# Patient Record
Sex: Male | Born: 1985 | Race: Black or African American | Hispanic: No | Marital: Married | Smoking: Current some day smoker
Health system: Southern US, Community
[De-identification: ages and names within clinical notes are randomized; demographics above are authoritative.]

---

## 2012-07-01 ENCOUNTER — Emergency Department (HOSPITAL_COMMUNITY): Payer: Self-pay

## 2012-07-01 ENCOUNTER — Encounter (HOSPITAL_COMMUNITY): Payer: Self-pay | Admitting: Emergency Medicine

## 2012-07-01 ENCOUNTER — Emergency Department (HOSPITAL_COMMUNITY)
Admission: EM | Admit: 2012-07-01 | Discharge: 2012-07-01 | Disposition: A | Discharge status: Home / Self Care | Payer: Self-pay | Attending: Emergency Medicine | Admitting: Emergency Medicine

## 2012-07-01 DIAGNOSIS — F17200 Nicotine dependence, unspecified, uncomplicated: Secondary | ICD-10-CM | POA: Insufficient documentation

## 2012-07-01 DIAGNOSIS — R0789 Other chest pain: Secondary | ICD-10-CM | POA: Insufficient documentation

## 2012-07-01 DIAGNOSIS — R079 Chest pain, unspecified: Secondary | ICD-10-CM

## 2012-07-01 DIAGNOSIS — R0602 Shortness of breath: Secondary | ICD-10-CM | POA: Insufficient documentation

## 2012-07-01 MED ORDER — OMEPRAZOLE 20 MG PO CPDR: 20.0000 mg | DELAYED_RELEASE_CAPSULE | Freq: Every day | ORAL | Status: DC

## 2012-07-01 NOTE — ED Notes (Signed)
Per EMS: pt pulled over c/o chest pain while driving; right sided off center non radiating lasted 15 minutes real sharp pain. No SOB and nausea. Pt states gets a lot of heart burn but this was different. 9/10 at its worst. No medication given. sats 100% on RA. BP 125/80 HR 70 SPO2 98%

## 2012-07-01 NOTE — ED Notes (Signed)
Pt denies chest pain currently; pt denies shortness of breath; denies dizziness and nausea. Pt mentating appropriately.

## 2012-07-01 NOTE — ED Notes (Signed)
Pt returned from Xray. Placed back on monitors.   

## 2012-07-01 NOTE — ED Provider Notes (Signed)
History     CSN: [id number]  Arrival date & time 07/01/12  1606   First MD Initiated Contact with Patient 07/01/12 1611      Chief Complaint  Patient presents with  . Chest Pain     The history is provided by the patient.   the patient reports development of chest discomfort while driving today.  He reports the pain was sharp and tied and located in his right chest.  This lasted approximately 15 minutes.  He had mild shortness of breath.  No recent long travel.  No history of PE or DVT.  The patient is 89 resultant has no medical problems.  No family history of early cardiac death in young males.  The patient denies exertional chest pain or shortness of breath.  He reports his occurred one other time.  He has not had anything the today.  He reports no symptoms at all this time.  Symptoms are mild to moderate when they occurred.  Vital signs are normal on arrival to the ER and were normal for EMS including a room air oxygen saturation 100%.  History reviewed. No pertinent past medical history.  History reviewed. No pertinent past surgical history.  History reviewed. No pertinent family history.  History  Substance Use Topics  . Smoking status: Current Some Day Smoker  . Smokeless tobacco: Not on file  . Alcohol Use: Yes     Comment: occasionally       Review of Systems  Cardiovascular: Positive for chest pain.  All other systems reviewed and are negative.    Allergies  Review of patient's allergies indicates no known allergies.  Home Medications   Current Outpatient Rx  Name  Route  Sig  Dispense  Refill  . OMEPRAZOLE 20 MG PO CPDR   Oral   Take 1 capsule (20 mg total) by mouth daily.   30 capsule   0     BP 125/76  Pulse 58  Temp 97.7 F (36.5 C) (Oral)  Resp 16  SpO2 99%  Physical Exam  Nursing note and vitals reviewed. Constitutional: He is oriented to person, place, and time. He appears well-developed and well-nourished.  HENT:  Head: Normocephalic  and atraumatic.  Eyes: EOM are normal.  Neck: Normal range of motion.  Cardiovascular: Normal rate, regular rhythm, normal heart sounds and intact distal pulses.   Pulmonary/Chest: Effort normal and breath sounds normal. No respiratory distress.  Abdominal: Soft. He exhibits no distension. There is no tenderness.  Musculoskeletal: Normal range of motion.  Neurological: He is alert and oriented to person, place, and time.  Skin: Skin is warm and dry.  Psychiatric: He has a normal mood and affect. Judgment normal.    ED Course  Procedures (including critical care time)   Date: 07/01/2012  Rate: 70  Rhythm: normal sinus rhythm  QRS Axis: normal  Intervals: normal  ST/T Wave abnormalities: normal  Conduction Disutrbances: none  Narrative Interpretation:   Old EKG Reviewed: No prior EKG      Labs Reviewed - No data to display Dg Chest 2 View  07/01/2012  *RADIOLOGY REPORT*  Clinical Data: Chest pain.  CHEST - 2 VIEW  Comparison: None.  Findings: There is 18 degrees of dextroconvex thoracic scoliosis as measured between T4 and T11.  Mild pectus excavatum observed with Haller index of 3.3.  Cardiac and mediastinal contours appear normal.  The lungs appear clear.  No pleural effusion is identified.  IMPRESSION:  1.  Dextroconvex thoracic scoliosis  and mild pectus excavatum as noted above.  No acute findings to account the patient's chest pain.   Original Report Authenticated By: Gaylyn Rong, M.D.    I personally reviewed the imaging tests through PACS system I reviewed available ER/hospitalization records through the EMR   1. Chest pain       MDM  The patient has no cardiac risk factors are family history of early cardiac death.  The patient's EKG and chest x-ray within normal limits.  I suspect some of this may be gastroesophageal reflux disease.  Patient be placed on Prilosec.  PCP followup        Lyanne Co, MD 07/01/12 [phone]

## 2012-07-01 NOTE — ED Notes (Signed)
Pt ambulatory leaving ED. Pt given d/c instructions and prescriptions. Pt has no further questions upon d/c and does not appear to be in any acute distress.

## 2012-07-01 NOTE — ED Notes (Signed)
Pt denies pain and shortness of breath.

## 2012-07-01 NOTE — ED Notes (Signed)
Pt currently denies chest pain; pt denies dizziness and nausea; pt denies shortness of breath. Pt mentating appropriately alert and oriented x 4.

## 2013-09-11 ENCOUNTER — Emergency Department (HOSPITAL_COMMUNITY)
Admission: EM | Admit: 2013-09-11 | Discharge: 2013-09-11 | Disposition: A | Discharge status: Home / Self Care | Payer: BC Managed Care – PPO | Attending: Emergency Medicine | Admitting: Emergency Medicine

## 2013-09-11 ENCOUNTER — Emergency Department (HOSPITAL_COMMUNITY): Payer: BC Managed Care – PPO

## 2013-09-11 ENCOUNTER — Encounter (HOSPITAL_COMMUNITY): Payer: Self-pay | Admitting: Emergency Medicine

## 2013-09-11 DIAGNOSIS — R062 Wheezing: Secondary | ICD-10-CM | POA: Insufficient documentation

## 2013-09-11 DIAGNOSIS — J111 Influenza due to unidentified influenza virus with other respiratory manifestations: Secondary | ICD-10-CM | POA: Insufficient documentation

## 2013-09-11 DIAGNOSIS — R0602 Shortness of breath: Secondary | ICD-10-CM | POA: Insufficient documentation

## 2013-09-11 DIAGNOSIS — R69 Illness, unspecified: Secondary | ICD-10-CM

## 2013-09-11 DIAGNOSIS — F17200 Nicotine dependence, unspecified, uncomplicated: Secondary | ICD-10-CM | POA: Insufficient documentation

## 2013-09-11 MED ORDER — ALBUTEROL SULFATE HFA 108 (90 BASE) MCG/ACT IN AERS
2.0000 | INHALATION_SPRAY | Freq: Once | RESPIRATORY_TRACT | Status: AC
  Administered 2013-09-11: 2 via RESPIRATORY_TRACT
  Filled 2013-09-11: qty 6.7

## 2013-09-11 MED ORDER — IBUPROFEN 800 MG PO TABS: 800.0000 mg | ORAL_TABLET | Freq: Three times a day (TID) | ORAL | Status: AC | PRN

## 2013-09-11 MED ORDER — IBUPROFEN 200 MG PO TABS
600.0000 mg | ORAL_TABLET | Freq: Once | ORAL | Status: AC
  Administered 2013-09-11: 600 mg via ORAL
  Filled 2013-09-11: qty 3

## 2013-09-11 MED ORDER — ACETAMINOPHEN 500 MG PO TABS
1000.0000 mg | ORAL_TABLET | Freq: Once | ORAL | Status: AC
  Administered 2013-09-11: 1000 mg via ORAL
  Filled 2013-09-11: qty 2

## 2013-09-11 MED ORDER — ALBUTEROL SULFATE (2.5 MG/3ML) 0.083% IN NEBU
5.0000 mg | INHALATION_SOLUTION | Freq: Once | RESPIRATORY_TRACT | Status: AC
  Administered 2013-09-11: 5 mg via RESPIRATORY_TRACT
  Filled 2013-09-11: qty 6

## 2013-09-11 MED ORDER — SODIUM CHLORIDE 0.9 % IV BOLUS (SEPSIS): 1000.0000 mL | Freq: Once | INTRAVENOUS | Status: DC

## 2013-09-11 MED ORDER — HYDROCODONE-HOMATROPINE 5-1.5 MG/5ML PO SYRP: 5.0000 mL | ORAL_SOLUTION | Freq: Four times a day (QID) | ORAL | Status: AC | PRN

## 2013-09-11 NOTE — ED Notes (Addendum)
Pt. reports dry cough with nasal congestion / chest congestion / chills / nausea and vomitting onset 3 days ago . Pt. also reported right groin pain .

## 2013-09-11 NOTE — ED Provider Notes (Signed)
Medical screening examination/treatment/procedure(s) were conducted as a shared visit with non-physician practitioner(s) or resident  and myself.  I personally evaluated the patient during the encounter and agree with the findings and plan unless otherwise indicated.    I have personally reviewed any xrays and/ or EKG's with the provider and I agree with interpretation.   Worsening cough, body aches, fevers for three days.  Child had mild URI.  No travel, other sick contacts, jail exposures, new rashes.  Exam mild tachycardia, dry mm, no murmus, no rashs, lungs clear, non toxic appearing.  Clinically flu sxs.  Discussed IV fluids vs po, pt prefers po fluids, antipyretics ordered.  CXR reviewed, no acute findings or infiltrate.   Flu symptoms, dehydration  Enid SkeensJoshua M Kati Riggenbach, MD 09/16/13 [phone]

## 2013-09-11 NOTE — ED Notes (Signed)
Began feeling ill with cough and congestion on 1/30.  Came home from work, went to bed thinking he could shake it off.  Did not feel any better.  Chest pain with cough, hurts to his testicles when he coughs.

## 2013-09-11 NOTE — ED Notes (Signed)
HHN completed.  Pt feels it has helped his breathing.

## 2013-09-11 NOTE — Discharge Instructions (Signed)
Read the information below.  Use the prescribed medication as directed.  Please discuss all new medications with your pharmacist.  You may return to the Emergency Department at any time for worsening condition or any new symptoms that concern you.  If you develop high fevers that do not resolve with tylenol or ibuprofen, you have difficulty swallowing or breathing, or you are unable to tolerate fluids by mouth, return to the ER for a recheck.     Viral Infections A viral infection can be caused by different types of viruses.Most viral infections are not serious and resolve on their own. However, some infections may cause severe symptoms and may lead to further complications. SYMPTOMS Viruses can frequently cause:  Minor sore throat.  Aches and pains.  Headaches.  Runny nose.  Different types of rashes.  Watery eyes.  Tiredness.  Cough.  Loss of appetite.  Gastrointestinal infections, resulting in nausea, vomiting, and diarrhea. These symptoms do not respond to antibiotics because the infection is not caused by bacteria. However, you might catch a bacterial infection following the viral infection. This is sometimes called a "superinfection." Symptoms of such a bacterial infection may include:  Worsening sore throat with pus and difficulty swallowing.  Swollen neck glands.  Chills and a high or persistent fever.  Severe headache.  Tenderness over the sinuses.  Persistent overall ill feeling (malaise), muscle aches, and tiredness (fatigue).  Persistent cough.  Yellow, green, or brown mucus production with coughing. HOME CARE INSTRUCTIONS   Only take over-the-counter or prescription medicines for pain, discomfort, diarrhea, or fever as directed by your caregiver.  Drink enough water and fluids to keep your urine clear or pale yellow. Sports drinks can provide valuable electrolytes, sugars, and hydration.  Get plenty of rest and maintain proper nutrition. Soups and  broths with crackers or rice are fine. SEEK IMMEDIATE MEDICAL CARE IF:   You have severe headaches, shortness of breath, chest pain, neck pain, or an unusual rash.  You have uncontrolled vomiting, diarrhea, or you are unable to keep down fluids.  You or your child has an oral temperature above 102 F (38.9 C), not controlled by medicine.  Your baby is older than 3 months with a rectal temperature of 102 F (38.9 C) or higher.  Your baby is 46 months old or younger with a rectal temperature of 100.4 F (38 C) or higher. MAKE SURE YOU:   Understand these instructions.  Will watch your condition.  Will get help right away if you are not doing well or get worse. Document Released: 05/07/2005 Document Revised: 10/20/2011 Document Reviewed: 12/02/2010 [facility] Patient Information 2014 [place], [place].  Influenza, Adult Influenza ("the flu") is a viral infection of the respiratory tract. It occurs more often in winter months because people spend more time in close contact with one another. Influenza can make you feel very sick. Influenza easily spreads from person to person (contagious). CAUSES  Influenza is caused by a virus that infects the respiratory tract. You can catch the virus by breathing in droplets from an infected person's cough or sneeze. You can also catch the virus by touching something that was recently contaminated with the virus and then touching your mouth, nose, or eyes. SYMPTOMS  Symptoms typically last 4 to 10 days and may include:  Fever.  Chills.  Headache, body aches, and muscle aches.  Sore throat.  Chest discomfort and cough.  Poor appetite.  Weakness or feeling tired.  Dizziness.  Nausea or vomiting. DIAGNOSIS  Diagnosis  of influenza is often made based on your history and a physical exam. A nose or throat swab test can be done to confirm the diagnosis. RISKS AND COMPLICATIONS You may be at risk for a more severe case of influenza if you smoke  cigarettes, have diabetes, have chronic heart disease (such as heart failure) or lung disease (such as asthma), or if you have a weakened immune system. Elderly people and pregnant women are also at risk for more serious infections. The most common complication of influenza is a lung infection (pneumonia). Sometimes, this complication can require emergency medical care and may be life-threatening. PREVENTION  An annual influenza vaccination (flu shot) is the best way to avoid getting influenza. An annual flu shot is now routinely recommended for all adults in the [place] TREATMENT  In mild cases, influenza goes away on its own. Treatment is directed at relieving symptoms. For more severe cases, your caregiver may prescribe antiviral medicines to shorten the sickness. Antibiotic medicines are not effective, because the infection is caused by a virus, not by bacteria. HOME CARE INSTRUCTIONS  Only take over-the-counter or prescription medicines for pain, discomfort, or fever as directed by your caregiver.  Use a cool mist humidifier to make breathing easier.  Get plenty of rest until your temperature returns to normal. This usually takes 3 to 4 days.  Drink enough fluids to keep your urine clear or pale yellow.  Cover your mouth and nose when coughing or sneezing, and wash your hands well to avoid spreading the virus.  Stay home from work or school until your fever has been gone for at least 1 full day. SEEK MEDICAL CARE IF:   You have chest pain or a deep cough that worsens or produces more mucus.  You have nausea, vomiting, or diarrhea. SEEK IMMEDIATE MEDICAL CARE IF:   You have difficulty breathing, shortness of breath, or your skin or nails turn bluish.  You have severe neck pain or stiffness.  You have a severe headache, facial pain, or earache.  You have a worsening or recurring fever.  You have nausea or vomiting that cannot be controlled. MAKE SURE YOU:  Understand these  instructions.  Will watch your condition.  Will get help right away if you are not doing well or get worse. Document Released: 07/25/2000 Document Revised: 01/27/2012 Document Reviewed: 10/27/2011 [facility] Patient Information 2014 [place], [place].    Emergency Department Resource Guide 1) Find a Doctor and Pay Out of Pocket Although you won't have to find out who is covered by your insurance plan, it is a good idea to ask around and get recommendations. You will then need to call the office and see if the doctor you have chosen will accept you as a new patient and what types of options they offer for patients who are self-pay. Some doctors offer discounts or will set up payment plans for their patients who do not have insurance, but you will need to ask so you aren't surprised when you get to your appointment.  2) Contact Your Local Health Department Not all health departments have doctors that can see patients for sick visits, but many do, so it is worth a call to see if yours does. If you don't know where your local health department is, you can check in your phone book. The CDC also has a tool to help you locate your state's health department, and many state websites also have listings of all of their local health departments.  3)  Find a Walk-in Clinic If your illness is not likely to be very severe or complicated, you may want to try a walk in clinic. These are popping up all over the country in pharmacies, drugstores, and shopping centers. They're usually staffed by nurse practitioners or physician assistants that have been trained to treat common illnesses and complaints. They're usually fairly quick and inexpensive. However, if you have serious medical issues or chronic medical problems, these are probably not your best option.  No Primary Care Doctor: - Call Health Connect at  [phone] - they can help you locate a primary care doctor that  accepts your insurance, provides certain  services, etc. - Physician Referral Service- [phone]  Chronic Pain Problems: Organization         Address  Phone   Notes  [facility]  [phone] Patients need to be referred by their primary care doctor.   Medication Assistance: Organization         Address  Phone   Notes  [facility] [address] [place], [place] [address] [phone]) [phone] --Must be a resident of [facility] -- Must have NO insurance coverage whatsoever (no Medicaid/ Medicare, etc.) -- The pt. MUST have a primary care doctor that directs their care regularly and follows them in the community   [facility]ist  [phone]   [employer]  [phone]    Agencies that provide inexpensive medical care: Organization         Address  Phone   Notes  [facility]  [phone]   [facility]    [phone]   [facility] [address] [place], [place] [address] [phone]) [phone]   [facility] [place] [address], [place] [phone]   Planned Parenthood    [phone]   [facility]    [phone]) [phone]   [facility]  [address], [place] Phone:  [phone]) [phone], Fax:  [phone] Hours of Operation:  9 am - 6 pm, M-F.  Also accepts Medicaid/Medicare and self-pay.  [facility]  [address], [place] Phone: [phone], Fax: [phone]. Hours of Operation:  8:30 am - 5:30 pm, M-F.  Also accepts Medicaid and self-pay.  [facility] [place] [address], [place] Phone: [phone]   [facility] [address], [place], [place] [phone], Ext. [phone] Mondays & Thursdays: 7-9 AM.  First 15 patients are seen on a first come, first serve basis.    Medicaid-accepting [facility] Providers:  Organization         Address  Phone   Notes  [facility] [address], [place] [phone] Also accepts self-pay patients.  [facility] [address], [place]  [phone]   [facility] [address], [place] [phone]   [facility] [address] [place] [address], [place] [phone]) [phone]   Renaye RakersVeita Bland [address], [place]   [phone] Only accepts [place] Access [place] patients after they have their name applied to their card.   Self-Pay (no insurance) in [facility]:  Organization         Address  Phone   Notes  Sickle Cell Patients, [facility] [address] [place] [phone]   [facility] [address] [place], [place] [phone]   [facility] [place]  [address], [place] [phone]   [facility]/Dr. Osei-Bonsu  [address] [place] or [address], [place] [phone] Phone number for both [place] and [place] locations is the same.  [facility] [address] [place] [phone]   [facility] [address] [place] or [address] [phone]   [facility] [address], [place] [phone], phone; [phone], fax Sees patients 1st and 3rd Saturday of every month.  Must not qualify for public or private insurance (i.e. Medicaid, Medicare, [place] Health Choice, Veterans' Benefits)  Household income should be no more than 200% of the poverty level The clinic cannot treat you if you are pregnant or think you are pregnant  Sexually transmitted diseases are not treated at the clinic.    Dental Care: Organization         Address  Phone  Notes  [facility] [address] Tyrelle Raczka [place], [place] [phone] Accepts  children up to age 51 who are enrolled in [place] or [place] Health Choice; pregnant women with a Medicaid card; and children who have applied for Medicaid or [place] Health Choice, but were declined, whose parents can pay a reduced fee at time of service.  [facility]  [address], [place] [phone] Accepts children up to age 78 who are enrolled in [place] or [place] Health Choice; pregnant women with a Medicaid card; and children who have applied for Medicaid or [place] Health Choice, but were declined, whose parents can pay a reduced fee at time of service.  [facility]  [address] [place], [place] [phone] Patients are seen by appointment only. Walk-ins are not accepted. [facility] will see patients 4 years of age and older. Monday - Tuesday (8am-5pm) Most Wednesdays (8:30-5pm) $30 per visit, cash only  [facility] Access PRO[facility]  [address], [facility] [phone] Patients are seen by appointment only. Walk-ins are not accepted. [facility] will see patients 54 years of age and older. One Wednesday Evening (Monthly: Volunteer Based).  $30 per visit, cash only  [employer]  [phone] for adults; Children under age 35, call Graduate Pediatric Dentistry at [phone]. Children aged 56-14, please call [phone] to request a pediatric application.  Dental services are provided in all areas of dental care including fillings, crowns and bridges, complete and partial dentures, implants, gum treatment, root canals, and extractions. Preventive care is also provided. Treatment is provided to both adults and children. Patients are selected via a lottery and there is often a waiting list.   [facility] [address], [place]  [phone] [email or web address]   [facility] [address] [place], [place] [phone], Ext. [phone] Second and  Fourth Thursday of each month, opens at 6:30 AM; Clinic ends at 9 AM.  Patients are seen on a first-come first-served basis, and a limited number are seen during each clinic.   [facility]  [address] Ether Griffins [place], [place] [phone]   Eligibility Requirements You must have lived in [place], [place],  or [place] counties for at least the last three months.   You cannot be eligible for state or federal sponsored [place], including [employer], [place], or [employer].   You generally cannot be eligible for healthcare insurance through your employer.    How to apply: Eligibility screenings are held every Tuesday and Wednesday afternoon from 1:00 pm until 4:00 pm. You do not need an appointment for the interview!  [facility] [address] [place], [place] [phone]   [facility]  [phone]   [facility] Health Department  [phone]   [facility] Health Department  [phone]    Behavioral Health Resources in the Community: Intensive Outpatient Programs Organization         Address  Phone  Notes  [facility] [address], [place], [place] [phone]   [facility] [address], [place], [place] [phone]   [facility] [address], [place], [place]  [phone]   [facility] [address]  [place], [place] [phone] or [phone]   Substance Abuse Resources Organization         Address  Phone  Notes  [facility] Services  [phone]   [facility]  [phone]   The [place]  [phone]   Floydene Flock  [phone]   Residential & Outpatient Substance Abuse Program  [phone]   Psychological Services Organization         Address  Phone  Notes  [facility] Behavioral Health  [phone]   [facility]  [phone]   [facility]  [address], [place] [phone] or [phone]    Mobile Crisis Teams Organization         Address  Phone  Notes  [facility], Mobile Crisis Care Unit  [phone]   [facility]  [address] [place], [place] [phone]   Doristine Locks [address] [place] [phone]    Self-Help/Support Groups Organization         Address  Phone             Notes  [facility] [place] - variety of support groups  336- I7437963 Call for more information  Narcotics Anonymous (NA), [facility] [address], [employer] [place]  2 meetings at this location   Statistician         Address  Phone  Notes  [facility] [address],    [place]  [phone]   [facility]  [address] [place] [address], [place], [place] [phone]   [facility] [address] [place], [place] [phone] Admissions: 8am-3pm M-F  [facility] [address]    [place], [place] [phone]   [facility] [address] [place], [place], [place] [phone]   [facility] [address],  [place], [place] [phone]   [facility] - Intensive Outpatient [address], [place], [place] [phone]   [facility] ([facility] [address] [place].,  [place], [place] [phone] or [phone]   [facility] ([facility]) [address], [place], [place] [phone] Accepts Medicaid  [facility] [address]  [place] [phone] Substance Abuse/Addiction Treatment   [facility] Resources Organization         Address  Phone  Notes  [facility]  [phone]   Angie Fava, PhD [address] Ervin Knack [place], [place]   [phone] or [phone]   [facility] Behavioral   [address] [place], [place] [phone]   [facility] [address], [place], [place] [phone] Insurance/Medicaid/sponsorship through [facility] [address]                                    [place], [place] [phone] Therapy/tele-psych/case  [facility] [address], [place] [phone]    Dr. Lolly Mustache  [phone]   [facility] [place]  [employer] [facility] Dept. 1) [address] [place] 2) [address] [place] 3)  [address], [place] [phone]  [phone]   [facility] Child Abuse Hotline (716)012-5413 or [phone] (After Hours)

## 2013-09-11 NOTE — ED Provider Notes (Signed)
Medical screening examination/treatment/procedure(s) were performed by non-physician practitioner and as supervising physician I was immediately available for consultation/collaboration.   Averly Ericson, MD 09/11/13 2256 

## 2013-09-11 NOTE — ED Notes (Signed)
Patient transported to X-ray 

## 2013-09-11 NOTE — ED Provider Notes (Signed)
CSN: [id number]     Arrival date & time 09/11/13  4098 History   First MD Initiated Contact with Patient 09/11/13 [phone]     Chief Complaint  Patient presents with  . Cough   (Consider location/radiation/quality/duration/timing/severity/associated sxs/prior Treatment) The history is provided by the patient and a significant other.   Patient has been sick x 3 days with cough, nasal congestion, sore throat, fevers, chills.  He has not eaten x 3 days due to lack of appetite.  Reports occasional SOB when attempting to cough up phlegm but states he is unable to cough anything up.  Associated burning in his chest.  Occasional shooting pain into his testicles only when he coughs, has not had this "in a long time." Denies any pain currently- chest, abdominal, or testicular.  Denies abdominal pain, testicular swelling or pain, dysuria.  No known sick contacts. No recent travel, leg swelling.    History reviewed. No pertinent past medical history. History reviewed. No pertinent past surgical history. No family history on file. History  Substance Use Topics  . Smoking status: Current Some Day Smoker  . Smokeless tobacco: Not on file  . Alcohol Use: Yes     Comment: occasionally     Review of Systems  Constitutional: Positive for fever and chills.  HENT: Positive for congestion and sore throat.   Respiratory: Positive for cough and shortness of breath.   Cardiovascular: Negative for chest pain.  Gastrointestinal: Negative for nausea, vomiting, abdominal pain and diarrhea.  Genitourinary: Negative for dysuria, urgency, frequency, discharge and scrotal swelling.  Musculoskeletal: Negative for neck stiffness.  Allergic/Immunologic: Negative for immunocompromised state.    Allergies  Review of patient's allergies indicates no known allergies.  Home Medications   Current Outpatient Rx  Name  Route  Sig  Dispense  Refill  . omeprazole (PRILOSEC) 20 MG capsule   Oral   Take 1 capsule (20 mg  total) by mouth daily.   30 capsule   0    BP 125/98  Pulse 112  Temp(Src) 98.8 F (37.1 C) (Oral)  Resp 14  Ht 5\' 6"  (1.676 m)  Wt 142 lb (64.411 kg)  BMI 22.93 kg/m2  SpO2 94% Physical Exam  Nursing note and vitals reviewed. Constitutional: He appears well-developed and well-nourished. No distress.  HENT:  Head: Normocephalic and atraumatic.  Neck: Neck supple.  Cardiovascular: Normal rate and regular rhythm.   Pulmonary/Chest: Effort normal. No respiratory distress. He has wheezes. He has rhonchi. He has no rales.  Wheezes and rhonchi, mostly in the RLF  Abdominal: Soft. He exhibits no distension and no mass. There is no tenderness. There is no rebound and no guarding.  Neurological: He is alert. He exhibits normal muscle tone.  Skin: He is not diaphoretic.    ED Course  Procedures (including critical care time) Labs Review Labs Reviewed - No data to display Imaging Review Dg Chest 2 View  09/11/2013   CLINICAL DATA:  Chest pain.  Short throat.  Cough.  EXAM: CHEST  2 VIEW  COMPARISON:  07/01/2012  FINDINGS: The heart size and mediastinal contours are within normal limits. Both lungs are clear. Mild dextroscoliosis of the lower thoracic spine. Bony thorax is intact.  IMPRESSION: No active cardiopulmonary disease.   Electronically Signed   By: Amie Portland M.D.   On: 09/11/2013 07:49    EKG Interpretation   None      8:11 AM Patient with much improved but persistent expiratory wheeze diffusely.  Pt reports  he is feeling much better.  Per note, Dr Jodi MourningZavitz has had a discussion with patient regarding IVF vs PO and patient states he now would like IVF.  He is currently also drinking PO fluids.  Will given 1 liter IVF.  Discussed supportive care medications.  Discussed xray results.   Pt rechecked, he had not received IVF bolus and decided to continue PO fluids, declined IV.  Discussed treatment plan for viral illness.    MDM   1. Influenza-like illness    Febrile but  nontoxic patient with constellation of symptoms suggestive of viral syndrome vs influenza.  Wheezing on exam improved with neb treatment.  Pt given HFA albuterol in ED as declined further neb treatments.  CXR negative.  Mild dehydration given patient's decreased oral intake due to lack of appetite. Encouraged PO fluids, pt able to tolerate fluids easily in ED. Discharged home with supportive care, PCP follow up.  Discussed result, findings, treatment, and follow up  with patient.  Pt given return precautions.  Pt verbalizes understanding and agrees with plan.         Trixie Dredgemily Dynastee Brummell, PA-C 09/11/13 1028

## 2018-04-16 ENCOUNTER — Emergency Department (HOSPITAL_COMMUNITY): Payer: BLUE CROSS/BLUE SHIELD

## 2018-04-16 ENCOUNTER — Encounter (HOSPITAL_COMMUNITY): Admission: EM | Disposition: E | Payer: Self-pay | Source: Home / Self Care

## 2018-04-16 ENCOUNTER — Emergency Department (HOSPITAL_COMMUNITY): Payer: BLUE CROSS/BLUE SHIELD | Admitting: Certified Registered"

## 2018-04-16 ENCOUNTER — Inpatient Hospital Stay (HOSPITAL_COMMUNITY)
Admission: EM | Admit: 2018-04-16 | Discharge: 2018-05-11 | DRG: 023 | Disposition: E | Payer: BLUE CROSS/BLUE SHIELD | Attending: General Surgery | Admitting: General Surgery

## 2018-04-16 ENCOUNTER — Encounter (HOSPITAL_COMMUNITY): Payer: Self-pay | Admitting: General Surgery

## 2018-04-16 DIAGNOSIS — S0219XB Other fracture of base of skull, initial encounter for open fracture: Secondary | ICD-10-CM | POA: Diagnosis present

## 2018-04-16 DIAGNOSIS — R402112 Coma scale, eyes open, never, at arrival to emergency department: Secondary | ICD-10-CM | POA: Diagnosis present

## 2018-04-16 DIAGNOSIS — R402212 Coma scale, best verbal response, none, at arrival to emergency department: Secondary | ICD-10-CM | POA: Diagnosis present

## 2018-04-16 DIAGNOSIS — E861 Hypovolemia: Secondary | ICD-10-CM | POA: Diagnosis present

## 2018-04-16 DIAGNOSIS — Z66 Do not resuscitate: Secondary | ICD-10-CM | POA: Diagnosis not present

## 2018-04-16 DIAGNOSIS — W3400XA Accidental discharge from unspecified firearms or gun, initial encounter: Secondary | ICD-10-CM

## 2018-04-16 DIAGNOSIS — S02401A Maxillary fracture, unspecified, initial encounter for closed fracture: Secondary | ICD-10-CM | POA: Diagnosis present

## 2018-04-16 DIAGNOSIS — S0193XA Puncture wound without foreign body of unspecified part of head, initial encounter: Secondary | ICD-10-CM

## 2018-04-16 DIAGNOSIS — J9691 Respiratory failure, unspecified with hypoxia: Secondary | ICD-10-CM | POA: Diagnosis present

## 2018-04-16 DIAGNOSIS — D62 Acute posthemorrhagic anemia: Secondary | ICD-10-CM | POA: Diagnosis present

## 2018-04-16 DIAGNOSIS — R402312 Coma scale, best motor response, none, at arrival to emergency department: Secondary | ICD-10-CM | POA: Diagnosis present

## 2018-04-16 DIAGNOSIS — S0990XA Unspecified injury of head, initial encounter: Secondary | ICD-10-CM

## 2018-04-16 DIAGNOSIS — I959 Hypotension, unspecified: Secondary | ICD-10-CM | POA: Diagnosis present

## 2018-04-16 HISTORY — PX: IR NEURO EACH ADD'L AFTER BASIC UNI LEFT (MS): IMG5373

## 2018-04-16 HISTORY — PX: IR ANGIO INTRA EXTRACRAN SEL COM CAROTID INNOMINATE BILAT MOD SED: IMG5360

## 2018-04-16 HISTORY — PX: IR ANGIOGRAM FOLLOW UP STUDY: IMG697

## 2018-04-16 HISTORY — PX: IR ANGIO VERTEBRAL SEL VERTEBRAL UNI L MOD SED: IMG5367

## 2018-04-16 HISTORY — PX: IR TRANSCATH/EMBOLIZ: IMG695

## 2018-04-16 HISTORY — PX: RADIOLOGY WITH ANESTHESIA: SHX6223

## 2018-04-16 LAB — POCT I-STAT 7, (LYTES, BLD GAS, ICA,H+H)
ACID-BASE DEFICIT: 1 mmol/L (ref 0.0–2.0)
Bicarbonate: 28 mmol/L (ref 20.0–28.0)
CALCIUM ION: 0.49 mmol/L — AB (ref 1.15–1.40)
HEMATOCRIT: 21 % — AB (ref 39.0–52.0)
HEMOGLOBIN: 7.1 g/dL — AB (ref 13.0–17.0)
O2 Saturation: 100 %
PH ART: 7.18 — AB (ref 7.350–7.450)
PO2 ART: 268 mmHg — AB (ref 83.0–108.0)
POTASSIUM: 5.9 mmol/L — AB (ref 3.5–5.1)
SODIUM: 146 mmol/L — AB (ref 135–145)
TCO2: 30 mmol/L (ref 22–32)
pCO2 arterial: 75.1 mmHg (ref 32.0–48.0)

## 2018-04-16 LAB — DIC (DISSEMINATED INTRAVASCULAR COAGULATION)PANEL
D-Dimer, Quant: >20 ug/mL-FEU — ABNORMAL HIGH (ref 0.00–0.50)
Fibrinogen: 126 mg/dL — ABNORMAL LOW (ref 210–475)
Prothrombin Time: 24.6 seconds — ABNORMAL HIGH (ref 11.4–15.2)
Smear Review: NONE SEEN

## 2018-04-16 LAB — COMPREHENSIVE METABOLIC PANEL
ALT: 10 U/L (ref 0–44)
AST: 19 U/L (ref 15–41)
Albumin: 1.5 g/dL — ABNORMAL LOW (ref 3.5–5.0)
Alkaline Phosphatase: 22 U/L — ABNORMAL LOW (ref 38–126)
BUN: 7 mg/dL (ref 6–20)
CALCIUM: 4.4 mg/dL — AB (ref 8.9–10.3)
CO2: 11 mmol/L — ABNORMAL LOW (ref 22–32)
Creatinine, Ser: 0.69 mg/dL (ref 0.61–1.24)
GFR calc non Af Amer: >60 mL/min (ref >60)
Glucose, Bld: 140 mg/dL — ABNORMAL HIGH (ref 70–99)
POTASSIUM: 2.3 mmol/L — AB (ref 3.5–5.1)
SODIUM: 149 mmol/L — AB (ref 135–145)
Total Bilirubin: 0.5 mg/dL (ref 0.3–1.2)
Total Protein: <3 g/dL — ABNORMAL LOW (ref 6.5–8.1)

## 2018-04-16 LAB — I-STAT CG4 LACTIC ACID, ED: Lactic Acid, Venous: 6.47 mmol/L (ref 0.5–1.9)

## 2018-04-16 LAB — PROTIME-INR
INR: 3.06
PROTHROMBIN TIME: 31.4 s — AB (ref 11.4–15.2)

## 2018-04-16 LAB — CBC
HCT: 23.8 % — ABNORMAL LOW (ref 39.0–52.0)
HEMOGLOBIN: 7.1 g/dL — AB (ref 13.0–17.0)
MCH: 31.7 pg (ref 26.0–34.0)
MCHC: 29.8 g/dL — ABNORMAL LOW (ref 30.0–36.0)
MCV: 106.3 fL — ABNORMAL HIGH (ref 78.0–100.0)
Platelets: 97 10*3/uL — ABNORMAL LOW (ref 150–400)
RBC: 2.24 MIL/uL — AB (ref 4.22–5.81)
RDW: 11.8 % (ref 11.5–15.5)
WBC: 3.8 10*3/uL — ABNORMAL LOW (ref 4.0–10.5)

## 2018-04-16 LAB — I-STAT CHEM 8, ED
BUN: 4 mg/dL — ABNORMAL LOW (ref 6–20)
CALCIUM ION: 0.71 mmol/L — AB (ref 1.15–1.40)
Chloride: 121 mmol/L — ABNORMAL HIGH (ref 98–111)
Creatinine, Ser: 0.5 mg/dL — ABNORMAL LOW (ref 0.61–1.24)
GLUCOSE: 129 mg/dL — AB (ref 70–99)
HCT: 17 % — ABNORMAL LOW (ref 39.0–52.0)
HEMOGLOBIN: 5.8 g/dL — AB (ref 13.0–17.0)
Potassium: 2.1 mmol/L — CL (ref 3.5–5.1)
SODIUM: 150 mmol/L — AB (ref 135–145)
TCO2: 12 mmol/L — AB (ref 22–32)

## 2018-04-16 LAB — DIC (DISSEMINATED INTRAVASCULAR COAGULATION) PANEL
APTT: 99 s — AB (ref 24–36)
INR: 2.24
PLATELETS: 78 10*3/uL — AB (ref 150–400)

## 2018-04-16 LAB — I-STAT TROPONIN, ED: TROPONIN I, POC: 0.06 ng/mL (ref 0.00–0.08)

## 2018-04-16 LAB — ETHANOL

## 2018-04-16 LAB — MASSIVE TRANSFUSION PROTOCOL ORDER (BLOOD BANK NOTIFICATION)

## 2018-04-16 SURGERY — IR WITH ANESTHESIA
Anesthesia: General

## 2018-04-16 MED ORDER — MIDAZOLAM HCL 2 MG/2ML IJ SOLN
INTRAMUSCULAR | Status: AC
  Filled 2018-04-16: qty 4

## 2018-04-16 MED ORDER — CEFAZOLIN SODIUM-DEXTROSE 2-4 GM/100ML-% IV SOLN
2.0000 g | Freq: Three times a day (TID) | INTRAVENOUS | Status: DC
  Administered 2018-04-16: 2 g via INTRAVENOUS
  Filled 2018-04-16: qty 100

## 2018-04-16 MED ORDER — CALCIUM CHLORIDE 10 % IV SOLN
INTRAVENOUS | Status: DC | PRN
  Administered 2018-04-16 (×2): 1000 mg via INTRAVENOUS
  Administered 2018-04-16: 300 mg via INTRAVENOUS

## 2018-04-16 MED ORDER — SODIUM CHLORIDE 0.9 % IV SOLN
INTRAVENOUS | Status: DC | PRN
  Administered 2018-04-16: 11:00:00 via INTRAVENOUS

## 2018-04-16 MED ORDER — IOPAMIDOL (ISOVUE-370) INJECTION 76%
50.0000 mL | Freq: Once | INTRAVENOUS | Status: AC | PRN
  Administered 2018-04-16: 50 mL via INTRAVENOUS

## 2018-04-16 MED ORDER — IOHEXOL 300 MG/ML  SOLN
150.0000 mL | Freq: Once | INTRAMUSCULAR | Status: AC | PRN
  Administered 2018-04-16: 90 mL via INTRA_ARTERIAL

## 2018-04-16 MED ORDER — TRANEXAMIC ACID 1000 MG/10ML IV SOLN
1000.0000 mg | Freq: Once | INTRAVENOUS | Status: AC
  Administered 2018-04-16: 1000 mg via INTRAVENOUS
  Filled 2018-04-16: qty 10

## 2018-04-16 MED ORDER — SODIUM CHLORIDE 0.9 % IV SOLN: INTRAVENOUS | Status: DC

## 2018-04-16 MED ORDER — FENTANYL CITRATE (PF) 100 MCG/2ML IJ SOLN: 100.0000 ug | Freq: Once | INTRAMUSCULAR | Status: DC | PRN

## 2018-04-16 MED ORDER — PROPOFOL 1000 MG/100ML IV EMUL: 25.0000 ug/kg/min | INTRAVENOUS | Status: DC

## 2018-04-16 MED ORDER — ROCURONIUM BROMIDE 10 MG/ML (PF) SYRINGE
PREFILLED_SYRINGE | INTRAVENOUS | Status: DC | PRN
  Administered 2018-04-16 (×2): 100 mg via INTRAVENOUS

## 2018-04-16 MED ORDER — TRANEXAMIC ACID 1000 MG/10ML IV SOLN
1000.0000 mg | Freq: Once | INTRAVENOUS | Status: DC
  Administered 2018-04-16: 1000 mg via INTRAVENOUS
  Filled 2018-04-16: qty 10

## 2018-04-16 MED ORDER — SODIUM BICARBONATE 8.4 % IV SOLN
INTRAVENOUS | Status: DC | PRN
  Administered 2018-04-16 (×2): 50 meq via INTRAVENOUS

## 2018-04-16 MED ORDER — ARTIFICIAL TEARS OPHTHALMIC OINT
1.0000 "application " | TOPICAL_OINTMENT | Freq: Three times a day (TID) | OPHTHALMIC | Status: DC
  Filled 2018-04-16: qty 3.5

## 2018-04-16 MED ORDER — IOPAMIDOL (ISOVUE-370) INJECTION 76%
INTRAVENOUS | Status: AC
  Filled 2018-04-16: qty 50

## 2018-04-16 MED ORDER — NITROGLYCERIN 1 MG/10 ML FOR IR/CATH LAB
INTRA_ARTERIAL | Status: AC | PRN
  Administered 2018-04-16: 25 ug via INTRA_ARTERIAL

## 2018-04-16 MED ORDER — FENTANYL BOLUS VIA INFUSION
50.0000 ug | INTRAVENOUS | Status: DC | PRN
  Filled 2018-04-16: qty 50

## 2018-04-16 MED ORDER — FENTANYL CITRATE (PF) 100 MCG/2ML IJ SOLN: 100.0000 ug | Freq: Once | INTRAMUSCULAR | Status: DC

## 2018-04-16 MED ORDER — SODIUM CHLORIDE 0.9 % IV SOLN
3.0000 ug/kg/min | INTRAVENOUS | Status: DC
  Filled 2018-04-16: qty 20

## 2018-04-16 MED ORDER — SUCCINYLCHOLINE CHLORIDE 20 MG/ML IJ SOLN
INTRAMUSCULAR | Status: AC | PRN
  Administered 2018-04-16: 100 mg via INTRAVENOUS

## 2018-04-16 MED ORDER — PROPOFOL 1000 MG/100ML IV EMUL
INTRAVENOUS | Status: AC
  Filled 2018-04-16: qty 100

## 2018-04-16 MED ORDER — LABETALOL HCL 5 MG/ML IV SOLN
INTRAVENOUS | Status: DC | PRN
  Administered 2018-04-16: 10 mg via INTRAVENOUS

## 2018-04-16 MED ORDER — MIDAZOLAM HCL 2 MG/2ML IJ SOLN
INTRAMUSCULAR | Status: DC | PRN
  Administered 2018-04-16: 4 mg via INTRAVENOUS

## 2018-04-16 MED ORDER — FENTANYL 2500MCG IN NS 250ML (10MCG/ML) PREMIX INFUSION: 100.0000 ug/h | INTRAVENOUS | Status: DC

## 2018-04-16 MED ORDER — NITROGLYCERIN 1 MG/10 ML FOR IR/CATH LAB
INTRA_ARTERIAL | Status: AC
  Filled 2018-04-16: qty 10
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
## 2018-04-16 NOTE — ED Notes (Signed)
[facility] notified Spoke with Daria Pastures Ref. # [id number]

## 2018-04-16 NOTE — ED Notes (Signed)
Plasma given :[id number] [phone] [id number] [phone] [id number] [phone] [id number] [phone] [id number] [phone] [id number] [phone] [id number] [phone] [id number] [id number] [id number]

## 2018-04-16 NOTE — ED Notes (Signed)
[id number] [id number]
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
## 2018-04-16 NOTE — Progress Notes (Signed)
RT extubated patient per order and family wishes with RN at bedside.

## 2018-04-16 NOTE — Progress Notes (Signed)
Chaplain met family members in lobby and took them to consultation room.  Notified CSW and MD they had arrived at ED.  Chaplain sat with family as ED doctor spoke with family and mother expressed wishes for DNR.  Accompanied family to 4N and offered support as they removed the breathing tube.  Offered prayer as patient died shortly thereafter. Rev. Tamsen Snider

## 2018-04-16 NOTE — Anesthesia Procedure Notes (Addendum)
Arterial Line Insertion Start/EndSeptember 19, 2019 10:45 AM, 04-29-2018 10:55 AM Performed by: Rosiland Oz, CRNA, CRNA  Emergency situation Left, radial was placed Catheter size: 20 G Hand hygiene performed  and maximum sterile barriers used   Attempts: 1 Procedure performed without using ultrasound guided technique. Following insertion, dressing applied. Post procedure assessment: normal  Patient tolerated the procedure well with no immediate complications.
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
## 2018-04-16 NOTE — ED Notes (Signed)
Plasma

## 2018-04-16 NOTE — Progress Notes (Signed)
Patient declared dead 29 with Meagan RN. [employer],Dr.Wyatt, and CDS aware. Family at bedside.Dicie Beam RN BSN

## 2018-04-16 NOTE — ED Notes (Signed)
Pressure dressing to the top of the head
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
## 2018-04-16 NOTE — ED Notes (Signed)
PRBC's given [id number] #[id number] [phone] [id number] 0901 [id number] [phone] [id number] [phone] [id number] [id number]
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
## 2018-04-16 NOTE — Anesthesia Postprocedure Evaluation (Signed)
Anesthesia Post Note  Patient: Joshua Le  Procedure(s) Performed: IR WITH ANESTHESIA (N/A )     Patient location during evaluation: NICU Anesthesia Type: General Level of consciousness: sedated Pain management: pain level controlled Vital Signs Assessment: vitals unstable Respiratory status: patient remains intubated per anesthesia plan, patient on ventilator - see flowsheet for VS and respiratory function unstable Cardiovascular status: unstable Postop Assessment: no apparent nausea or vomiting Anesthetic complications: no    Last Vitals:  Vitals:   05-06-18 1220 06-May-2018 1221  BP: 110/70   Pulse: 87   Resp: 20   SpO2: (!) 72% (!) 72%    Last Pain: There were no vitals filed for this visit.               Kadrian Partch COKER

## 2018-04-16 NOTE — Procedures (Signed)
Intubation Procedure Note DOLPH TAVANO [id number] 1986-04-07  Procedure: Intubation Indications: Airway protection and maintenance  Procedure Details Consent: Unable to obtain consent because of emergent medical necessity. Time Out: Verified patient identification, verified procedure, site/side was marked, verified correct patient position, special equipment/implants available, medications/allergies/relevent history reviewed, required imaging and test results available.  Performed  Maximum sterile technique was used including cap, gloves, gown, hand hygiene and mask.  MAC and 4    Evaluation Hemodynamic Status: Persistent hypotension treated with fluid; O2 sats: currently acceptable Patient's Current Condition: stable Complications: No apparent complications Patient did tolerate procedure well. Chest X-ray ordered to verify placement.  CXR: tube position acceptable.   Marina Gravel [facility] 04/19/2018

## 2018-04-16 NOTE — Progress Notes (Signed)
Patient transported from IR to room 4N22.  Per anesthesia, had to put patient on pressure control during procedure due to increased peak pressure and not being able to ventilate effectively.  After switching from anesthesia ventilator to our ventilator patient began to desat even with increased PEEP to 12 and FIO2 at 100%.  Patient sats still stayed low in the 70s during transport.  At this time patient's sats 71%.  MD currently in patient room.  Will continue to monitor.

## 2018-04-16 NOTE — Anesthesia Preprocedure Evaluation (Signed)
Anesthesia Evaluation  Patient identified by MRN, date of birth, ID band Patient unresponsive    Reviewed: Unable to perform ROS - Chart review only  Airway Mallampati: Intubated       Dental   Pulmonary    + rhonchi    rales    Cardiovascular  Rhythm:Regular Rate:Tachycardia     Neuro/Psych    GI/Hepatic   Endo/Other    Renal/GU      Musculoskeletal   Abdominal   Peds  Hematology   Anesthesia Other Findings   Reproductive/Obstetrics                             Anesthesia Physical Anesthesia Plan  ASA: IV and emergent  Anesthesia Plan: General   Post-op Pain Management:    Induction: Intravenous  PONV Risk Score and Plan:   Airway Management Planned: Oral ETT  Additional Equipment: Arterial line  Intra-op Plan:   Post-operative Plan:   Informed Consent: I have reviewed the patients History and Physical, chart, labs and discussed the procedure including the risks, benefits and alternatives for the proposed anesthesia with the patient or authorized representative who has indicated his/her understanding and acceptance.     Plan Discussed with: CRNA and Anesthesiologist  Anesthesia Plan Comments:         Anesthesia Quick Evaluation

## 2018-04-16 NOTE — ED Notes (Signed)
[id number]
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
## 2018-04-16 NOTE — Procedures (Signed)
Central Venous Catheter Insertion Procedure Note ARY PERE [id number] [birth year]  Procedure: Insertion of Central Venous Catheter Indications: Frequent blood sampling and Massive transfusion protocol  Procedure Details Consent: Unable to obtain consent because of emergent medical necessity. Time Out: Verified patient identification, verified procedure, site/side was marked, verified correct patient position, special equipment/implants available, medications/allergies/relevent history reviewed, required imaging and test results available.  Performed  Maximum sterile technique was used including gloves and gown. Skin prep: Chlorhexidine; local anesthetic administered A antimicrobial bonded/coated triple lumen catheter was placed in the right femoral vein due to emergent situation using the Seldinger technique.  Evaluation Blood flow good Complications: No apparent complications Patient did tolerate procedure well. Chest X-ray ordered to verify placement.  CXR: not needed.  Jimmye Norman 04-May-2018, 1:51 PM

## 2018-04-16 NOTE — Transfer of Care (Signed)
Immediate Anesthesia Transfer of Care Note  Patient: AMASA MCKECHNIE  Procedure(s) Performed: IR WITH ANESTHESIA (N/A )  Patient Location: NICU  Anesthesia Type:General  Level of Consciousness: comatose and Patient remains intubated per anesthesia plan  Airway & Oxygen Therapy: Patient remains intubated per anesthesia plan and Patient placed on Ventilator (see vital sign flow sheet for setting)  Post-op Assessment: Report given to RN  Post vital signs: Reviewed  Last Vitals:  Vitals Value Taken Time  BP 110/70 21-Apr-2018 12:20 PM  Temp    Pulse 93 Apr 21, 2018 12:31 PM  Resp 20 04/21/18 12:31 PM  SpO2 69 % 04/21/18 12:31 PM  Vitals shown include unvalidated device data.  Last Pain: There were no vitals filed for this visit.       Complications: No apparent anesthesia complications
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
## 2018-04-17 LAB — PREPARE PLATELET PHERESIS
Unit division: 0
Unit division: 0
Unit division: 0

## 2018-04-17 LAB — BPAM CRYOPRECIPITATE
BLOOD PRODUCT EXPIRATION DATE: 201909061528
BLOOD PRODUCT EXPIRATION DATE: 201909061548
Blood Product Expiration Date: 201909061638
Blood Product Expiration Date: 201909061710
ISSUE DATE / TIME: 201909060942
ISSUE DATE / TIME: 201909061006
ISSUE DATE / TIME: 201909061058
ISSUE DATE / TIME: 201909061128
UNIT TYPE AND RH: 6200
UNIT TYPE AND RH: 6200
Unit Type and Rh: 6200
Unit Type and Rh: 6200

## 2018-04-17 LAB — POCT I-STAT 3, ART BLOOD GAS (G3+)
Bicarbonate: 27.8 mmol/L (ref 20.0–28.0)
O2 Saturation: 72 %
PCO2 ART: 56.3 mmHg — AB (ref 32.0–48.0)
PH ART: 7.284 — AB (ref 7.350–7.450)
PO2 ART: 36 mmHg — AB (ref 83.0–108.0)
Patient temperature: 33.6
TCO2: 30 mmol/L (ref 22–32)

## 2018-04-17 LAB — BPAM PLATELET PHERESIS
BLOOD PRODUCT EXPIRATION DATE: 201909072359
Blood Product Expiration Date: 201909062359
Blood Product Expiration Date: 201909072359
ISSUE DATE / TIME: 201909060930
ISSUE DATE / TIME: 201909061036
ISSUE DATE / TIME: 201909061110
UNIT TYPE AND RH: 5100
UNIT TYPE AND RH: 7300
Unit Type and Rh: 6200

## 2018-04-17 LAB — PREPARE CRYOPRECIPITATE
UNIT DIVISION: 0
UNIT DIVISION: 0
Unit division: 0
Unit division: 0

## 2018-04-17 LAB — CDS SEROLOGY

## 2018-04-17 LAB — ABO/RH: ABO/RH(D): A POS

## 2018-04-18 LAB — TYPE AND SCREEN
ABO/RH(D): A POS
Antibody Screen: NEGATIVE
UNIT DIVISION: 0
UNIT DIVISION: 0
UNIT DIVISION: 0
UNIT DIVISION: 0
UNIT DIVISION: 0
UNIT DIVISION: 0
UNIT DIVISION: 0
UNIT DIVISION: 0
UNIT DIVISION: 0
UNIT DIVISION: 0
UNIT DIVISION: 0
UNIT DIVISION: 0
UNIT DIVISION: 0
UNIT DIVISION: 0
Unit division: 0
Unit division: 0
Unit division: 0
Unit division: 0
Unit division: 0
Unit division: 0
Unit division: 0
Unit division: 0
Unit division: 0
Unit division: 0
Unit division: 0
Unit division: 0
Unit division: 0
Unit division: 0
Unit division: 0
Unit division: 0
Unit division: 0
Unit division: 0

## 2018-04-18 LAB — BPAM RBC
BLOOD PRODUCT EXPIRATION DATE: 201909232359
BLOOD PRODUCT EXPIRATION DATE: 201909232359
BLOOD PRODUCT EXPIRATION DATE: 201909232359
BLOOD PRODUCT EXPIRATION DATE: 201909232359
BLOOD PRODUCT EXPIRATION DATE: 201909232359
BLOOD PRODUCT EXPIRATION DATE: 201909232359
BLOOD PRODUCT EXPIRATION DATE: 201910012359
BLOOD PRODUCT EXPIRATION DATE: 201910012359
BLOOD PRODUCT EXPIRATION DATE: 201910012359
BLOOD PRODUCT EXPIRATION DATE: 201910012359
BLOOD PRODUCT EXPIRATION DATE: 201910032359
BLOOD PRODUCT EXPIRATION DATE: 201910032359
BLOOD PRODUCT EXPIRATION DATE: 201910032359
Blood Product Expiration Date: 201909232359
Blood Product Expiration Date: 201909232359
Blood Product Expiration Date: 201909232359
Blood Product Expiration Date: 201909232359
Blood Product Expiration Date: 201909232359
Blood Product Expiration Date: 201909262359
Blood Product Expiration Date: 201909272359
Blood Product Expiration Date: 201909272359
Blood Product Expiration Date: 201909272359
Blood Product Expiration Date: 201909272359
Blood Product Expiration Date: 201909282359
Blood Product Expiration Date: 201910012359
Blood Product Expiration Date: 201910012359
Blood Product Expiration Date: 201910012359
Blood Product Expiration Date: 201910012359
Blood Product Expiration Date: 201910012359
Blood Product Expiration Date: 201910012359
Blood Product Expiration Date: 201910032359
Blood Product Expiration Date: 201910032359
ISSUE DATE / TIME: 201909060842
ISSUE DATE / TIME: 201909060910
ISSUE DATE / TIME: 201909060917
ISSUE DATE / TIME: 201909060917
ISSUE DATE / TIME: 201909060928
ISSUE DATE / TIME: 201909060958
ISSUE DATE / TIME: 201909061109
ISSUE DATE / TIME: 201909061109
ISSUE DATE / TIME: 201909061114
ISSUE DATE / TIME: 201909061114
ISSUE DATE / TIME: 201909060842
ISSUE DATE / TIME: 201909060842
ISSUE DATE / TIME: 201909060842
ISSUE DATE / TIME: 201909060910
ISSUE DATE / TIME: 201909060917
ISSUE DATE / TIME: 201909060917
ISSUE DATE / TIME: 201909060928
ISSUE DATE / TIME: 201909060928
ISSUE DATE / TIME: 201909060928
ISSUE DATE / TIME: 201909060958
ISSUE DATE / TIME: 201909061030
ISSUE DATE / TIME: 201909061030
ISSUE DATE / TIME: 201909061109
ISSUE DATE / TIME: 201909061109
ISSUE DATE / TIME: 201909061114
ISSUE DATE / TIME: 201909061114
ISSUE DATE / TIME: 201909061355
ISSUE DATE / TIME: 201909080029
UNIT TYPE AND RH: 6200
UNIT TYPE AND RH: 6200
UNIT TYPE AND RH: 6200
UNIT TYPE AND RH: 6200
UNIT TYPE AND RH: 6200
UNIT TYPE AND RH: 6200
UNIT TYPE AND RH: 6200
UNIT TYPE AND RH: 6200
UNIT TYPE AND RH: 6200
UNIT TYPE AND RH: 6200
UNIT TYPE AND RH: 6200
UNIT TYPE AND RH: 9500
UNIT TYPE AND RH: 9500
UNIT TYPE AND RH: 9500
Unit Type and Rh: 6200
Unit Type and Rh: 6200
Unit Type and Rh: 6200
Unit Type and Rh: 6200
Unit Type and Rh: 6200
Unit Type and Rh: 6200
Unit Type and Rh: 6200
Unit Type and Rh: 6200
Unit Type and Rh: 6200
Unit Type and Rh: 6200
Unit Type and Rh: 6200
Unit Type and Rh: 6200
Unit Type and Rh: 6200
Unit Type and Rh: 6200
Unit Type and Rh: 6200
Unit Type and Rh: 9500
Unit Type and Rh: 9500
Unit Type and Rh: 9500

## 2018-04-19 ENCOUNTER — Encounter (HOSPITAL_COMMUNITY): Payer: Self-pay | Admitting: Interventional Radiology

## 2018-04-19 LAB — BPAM FFP
BLOOD PRODUCT EXPIRATION DATE: 201909072359
BLOOD PRODUCT EXPIRATION DATE: 201909072359
BLOOD PRODUCT EXPIRATION DATE: 201909072359
BLOOD PRODUCT EXPIRATION DATE: 201909072359
BLOOD PRODUCT EXPIRATION DATE: 201909112359
BLOOD PRODUCT EXPIRATION DATE: 201909112359
BLOOD PRODUCT EXPIRATION DATE: 201909112359
BLOOD PRODUCT EXPIRATION DATE: 201909112359
BLOOD PRODUCT EXPIRATION DATE: 201909112359
BLOOD PRODUCT EXPIRATION DATE: 201909262359
BLOOD PRODUCT EXPIRATION DATE: 201909262359
Blood Product Expiration Date: 201909072359
Blood Product Expiration Date: 201909072359
Blood Product Expiration Date: 201909072359
Blood Product Expiration Date: 201909072359
Blood Product Expiration Date: 201909072359
Blood Product Expiration Date: 201909072359
Blood Product Expiration Date: 201909112359
Blood Product Expiration Date: 201909112359
Blood Product Expiration Date: 201909112359
Blood Product Expiration Date: 201909112359
Blood Product Expiration Date: 201909112359
Blood Product Expiration Date: 201909112359
Blood Product Expiration Date: 201909112359
Blood Product Expiration Date: 201909112359
Blood Product Expiration Date: 201909112359
Blood Product Expiration Date: 201909112359
Blood Product Expiration Date: 201909112359
Blood Product Expiration Date: 201909112359
Blood Product Expiration Date: 201909112359
Blood Product Expiration Date: 201909112359
Blood Product Expiration Date: 201909112359
Blood Product Expiration Date: 201909272359
Blood Product Expiration Date: 201909272359
ISSUE DATE / TIME: 201909060842
ISSUE DATE / TIME: 201909060909
ISSUE DATE / TIME: 201909060909
ISSUE DATE / TIME: 201909060911
ISSUE DATE / TIME: 201909060915
ISSUE DATE / TIME: 201909060915
ISSUE DATE / TIME: 201909060915
ISSUE DATE / TIME: 201909060915
ISSUE DATE / TIME: 201909060944
ISSUE DATE / TIME: 201909060944
ISSUE DATE / TIME: 201909060944
ISSUE DATE / TIME: 201909060944
ISSUE DATE / TIME: 201909061025
ISSUE DATE / TIME: 201909061025
ISSUE DATE / TIME: 201909061033
ISSUE DATE / TIME: 201909061033
ISSUE DATE / TIME: 201909061111
ISSUE DATE / TIME: 201909061111
ISSUE DATE / TIME: 201909071532
ISSUE DATE / TIME: 201909071542
ISSUE DATE / TIME: 201909060842
ISSUE DATE / TIME: 201909060911
ISSUE DATE / TIME: 201909060952
ISSUE DATE / TIME: 201909061025
ISSUE DATE / TIME: 201909061033
ISSUE DATE / TIME: 201909061034
ISSUE DATE / TIME: 201909061111
ISSUE DATE / TIME: 201909061111
ISSUE DATE / TIME: 201909071532
ISSUE DATE / TIME: 201909071542
ISSUE DATE / TIME: 201909071601
UNIT TYPE AND RH: 600
UNIT TYPE AND RH: 600
UNIT TYPE AND RH: 6200
UNIT TYPE AND RH: 6200
UNIT TYPE AND RH: 6200
UNIT TYPE AND RH: 6200
UNIT TYPE AND RH: 6200
UNIT TYPE AND RH: 6200
UNIT TYPE AND RH: 6200
UNIT TYPE AND RH: 6200
UNIT TYPE AND RH: 6200
UNIT TYPE AND RH: 6200
UNIT TYPE AND RH: 6200
UNIT TYPE AND RH: 6200
UNIT TYPE AND RH: 6200
UNIT TYPE AND RH: 6200
UNIT TYPE AND RH: 6200
UNIT TYPE AND RH: 6200
Unit Type and Rh: 600
Unit Type and Rh: 600
Unit Type and Rh: 600
Unit Type and Rh: 600
Unit Type and Rh: 6200
Unit Type and Rh: 6200
Unit Type and Rh: 6200
Unit Type and Rh: 6200
Unit Type and Rh: 6200
Unit Type and Rh: 6200
Unit Type and Rh: 6200
Unit Type and Rh: 6200
Unit Type and Rh: 6200
Unit Type and Rh: 6200
Unit Type and Rh: 6200
Unit Type and Rh: 6200

## 2018-04-19 LAB — PREPARE FRESH FROZEN PLASMA
UNIT DIVISION: 0
UNIT DIVISION: 0
UNIT DIVISION: 0
UNIT DIVISION: 0
UNIT DIVISION: 0
UNIT DIVISION: 0
UNIT DIVISION: 0
UNIT DIVISION: 0
UNIT DIVISION: 0
UNIT DIVISION: 0
Unit division: 0
Unit division: 0
Unit division: 0
Unit division: 0
Unit division: 0
Unit division: 0
Unit division: 0
Unit division: 0
Unit division: 0
Unit division: 0
Unit division: 0
Unit division: 0
Unit division: 0
Unit division: 0
Unit division: 0
Unit division: 0

## 2018-04-20 ENCOUNTER — Encounter (HOSPITAL_COMMUNITY): Payer: Self-pay | Admitting: Interventional Radiology

## 2018-04-23 ENCOUNTER — Encounter (HOSPITAL_COMMUNITY): Payer: Self-pay | Admitting: Emergency Medicine
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
## 2018-05-11 DEATH — deceased
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
